# Patient Record
Sex: Male | Born: 1955 | Race: White | Hispanic: No | Marital: Single | Smoking: Former smoker
Health system: Southern US, Community
[De-identification: ages and names within clinical notes are randomized; demographics above are authoritative.]

---

## 2003-09-19 ENCOUNTER — Emergency Department (HOSPITAL_COMMUNITY): Admission: EM | Admit: 2003-09-19 | Discharge: 2003-09-20 | Payer: Self-pay | Admitting: Emergency Medicine

## 2006-07-20 ENCOUNTER — Ambulatory Visit (HOSPITAL_COMMUNITY): Admission: RE | Admit: 2006-07-20 | Discharge: 2006-07-21 | Payer: Self-pay | Admitting: Neurological Surgery

## 2006-07-24 ENCOUNTER — Emergency Department (HOSPITAL_COMMUNITY): Admission: EM | Admit: 2006-07-24 | Discharge: 2006-07-24 | Payer: Self-pay | Admitting: Emergency Medicine

## 2018-01-31 ENCOUNTER — Encounter (HOSPITAL_COMMUNITY): Payer: Self-pay | Admitting: Emergency Medicine

## 2018-01-31 ENCOUNTER — Ambulatory Visit (HOSPITAL_COMMUNITY)
Admission: EM | Admit: 2018-01-31 | Discharge: 2018-01-31 | Disposition: A | Discharge status: Home / Self Care | Payer: BLUE CROSS/BLUE SHIELD | Attending: Internal Medicine | Admitting: Internal Medicine

## 2018-01-31 DIAGNOSIS — Z23 Encounter for immunization: Secondary | ICD-10-CM | POA: Diagnosis not present

## 2018-01-31 DIAGNOSIS — S41111A Laceration without foreign body of right upper arm, initial encounter: Secondary | ICD-10-CM

## 2018-01-31 DIAGNOSIS — W260XXA Contact with knife, initial encounter: Secondary | ICD-10-CM

## 2018-01-31 MED ORDER — TETANUS-DIPHTH-ACELL PERTUSSIS 5-2.5-18.5 LF-MCG/0.5 IM SUSP
0.5000 mL | Freq: Once | INTRAMUSCULAR | Status: AC
  Administered 2018-01-31: 0.5 mL via INTRAMUSCULAR

## 2018-01-31 MED ORDER — LIDOCAINE-EPINEPHRINE (PF) 2 %-1:200000 IJ SOLN
INTRAMUSCULAR | Status: AC
  Filled 2018-01-31: qty 20

## 2018-01-31 MED ORDER — TETANUS-DIPHTH-ACELL PERTUSSIS 5-2.5-18.5 LF-MCG/0.5 IM SUSP
INTRAMUSCULAR | Status: AC
  Filled 2018-01-31: qty 0.5

## 2018-01-31 NOTE — ED Provider Notes (Signed)
[facility]    CSN: [id number] Arrival date & time: 01/31/18  1511     History   Chief Complaint Chief Complaint  Patient presents with  . Laceration    HPI Garrett Cooper is a 62 y.o. male.   62 year old male comes in for laceration to the right upper arm.  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  Bleeding controlled with pressure and gauze.  He denies any numbness and tingling, weakness in the fingers.  Denies blood thinner use.  Unknown last tetanus.      History reviewed. No pertinent past medical history.  There are no active problems to display for this patient.   History reviewed. No pertinent surgical history.     Home Medications    Prior to Admission medications   Not on File    Family History History reviewed. No pertinent family history.  Social History Social History   Tobacco Use  . Smoking status: Current Every Day Smoker  . Smokeless tobacco: Never Used  Substance Use Topics  . Alcohol use: Not on file  . Drug use: Never     Allergies   Oxycodone   Review of Systems Review of Systems  Reason unable to perform ROS: See HPI as above.     Physical Exam Triage Vital Signs ED Triage Vitals [01/31/18 1537]  Enc Vitals Group     BP 126/88     Pulse Rate 87     Resp 16     Temp (!) 97.4 F (36.3 C)     Temp Source Oral     SpO2 95 %     Weight      Height      Head Circumference      Peak Flow      Pain Score      Pain Loc      Pain Edu?      Excl. in GC?    No data found.  Updated Vital Signs BP 126/88 (BP Location: Right Arm)   Pulse 87   Temp (!) 97.4 F (36.3 C) (Oral)   Resp 16   SpO2 95%    Physical Exam  Constitutional: He is oriented to person, place, and time. He appears well-developed and well-nourished. No distress.  HENT:  Head: Normocephalic and atraumatic.  Eyes: Pupils are equal, round, and reactive to light. Conjunctivae are  normal.  Musculoskeletal:  1 cm laceration to the ventral aspect of the right upper arm, 4 to 5 cm superior from the elbow.  Bleeding controlled with pressure.  No foreign body seen.  No tenderness to palpation of the area.  Strength normal and equal bilaterally of the elbow, wrist, fingers.  Sensation intact and equal bilaterally.  Radial pulse 2+ and equal bilaterally.  Cap refill less than 2 seconds.  Neurological: He is alert and oriented to person, place, and time.  Skin: He is not diaphoretic.     UC Treatments / Results  Labs (all labs ordered are listed, but only abnormal results are displayed) Labs Reviewed - No data to display  EKG None  Radiology No results found.  Procedures Laceration Repair Date/Time: 01/31/2018 4:39 PM Performed by: Belinda FisherYu, Dakari Cregger V, PA-C Authorized by: Arnaldo Nataliamond, Michael S, MD   Consent:    Consent obtained:  Verbal   Consent given by:  Patient   Risks discussed:  Infection, need for additional repair, nerve damage, pain, poor cosmetic  result, poor wound healing, retained foreign body, tendon damage and vascular damage   Alternatives discussed:  Referral Anesthesia (see MAR for exact dosages):    Anesthesia method:  Local infiltration   Local anesthetic:  Lidocaine 2% WITH epi Laceration details:    Location:  Shoulder/arm   Shoulder/arm location:  R upper arm   Length (cm):  1   Depth (mm):  3 Repair type:    Repair type:  Simple Exploration:    Hemostasis achieved with:  Direct pressure and epinephrine   Wound exploration: wound explored through full range of motion and entire depth of wound probed and visualized   Treatment:    Area cleansed with:  Hibiclens   Amount of cleaning:  Standard   Irrigation solution:  Sterile saline   Irrigation method:  Pressure wash   Visualized foreign bodies/material removed: no   Skin repair:    Repair method:  Sutures   Suture size:  5-0   Suture material:  Prolene   Suture technique:  Simple  interrupted   Number of sutures:  2 Approximation:    Approximation:  Close Post-procedure details:    Dressing:  Antibiotic ointment and bulky dressing   Patient tolerance of procedure:  Tolerated well, no immediate complications   (including critical care time)  Medications Ordered in UC Medications  Tdap (BOOSTRIX) injection 0.5 mL (has no administration in time range)    Initial Impression / Assessment and Plan / UC Course  I have reviewed the triage vital signs and the nursing notes.  Pertinent labs & imaging results that were available during my care of the patient were reviewed by me and considered in my medical decision making (see chart for details).    Patient tolerated procedure well.  2 sutures applied today.  Tetanus updated.  Wound care instructions provided.  Return precautions given.  Otherwise follow-up in 7 days for suture removal.  Resources on hand orthopedics provided, instructed to follow-up if developing symptoms such as weakness to the fingers, numbness, tingling.  Patient expresses understanding and agrees to plan.  Final Clinical Impressions(s) / UC Diagnoses   Final diagnoses:  Laceration of right upper extremity, initial encounter    ED Prescriptions    None        Belinda Fisher, PA-C 01/31/18 1641

## 2018-01-31 NOTE — Discharge Instructions (Signed)
Tetanus updated. 2 sutures placed.  You can remove current dressing in 24 hours.  Do not get the area wet for the first 24 hours.  Afterwards, he can clean area with soap and water gently.  Keep wound clean and dry.  Monitor for spreading redness, increased warmth, fever follow-up for reevaluation.  Otherwise follow-up in 7 days for suture removal.  If experiencing numbness and tingling, weakness in your arm/fingers, follow-up with orthopedics for further evaluation needed.

## 2018-01-31 NOTE — ED Triage Notes (Signed)
Pt here for laceration from knife to right upper arm

## 2018-02-06 ENCOUNTER — Encounter (HOSPITAL_COMMUNITY): Payer: Self-pay | Admitting: Emergency Medicine

## 2018-02-06 ENCOUNTER — Ambulatory Visit (HOSPITAL_COMMUNITY)
Admission: EM | Admit: 2018-02-06 | Discharge: 2018-02-06 | Disposition: A | Discharge status: Home / Self Care | Payer: BLUE CROSS/BLUE SHIELD

## 2018-02-06 DIAGNOSIS — S41111D Laceration without foreign body of right upper arm, subsequent encounter: Secondary | ICD-10-CM | POA: Diagnosis not present

## 2018-02-06 DIAGNOSIS — Z4802 Encounter for removal of sutures: Secondary | ICD-10-CM

## 2018-02-06 NOTE — ED Triage Notes (Signed)
Pt here for suture removal to back of arm; 2 sutures removed

## 2019-08-15 ENCOUNTER — Other Ambulatory Visit: Payer: Self-pay | Admitting: *Deleted

## 2019-08-15 DIAGNOSIS — Z87891 Personal history of nicotine dependence: Secondary | ICD-10-CM

## 2019-08-29 LAB — COLOGUARD

## 2019-08-30 ENCOUNTER — Telehealth: Payer: Self-pay | Admitting: Acute Care

## 2019-08-30 NOTE — Telephone Encounter (Signed)
Attempted to call pt x2 to get more information from him but each time I called, the line rang busy. Will try to call back later.

## 2019-09-02 ENCOUNTER — Ambulatory Visit
Admission: RE | Admit: 2019-09-02 | Discharge: 2019-09-02 | Disposition: A | Discharge status: Home / Self Care | Payer: 59 | Source: Ambulatory Visit | Attending: Acute Care | Admitting: Acute Care

## 2019-09-02 ENCOUNTER — Ambulatory Visit (INDEPENDENT_AMBULATORY_CARE_PROVIDER_SITE_OTHER): Payer: 59 | Admitting: Acute Care

## 2019-09-02 ENCOUNTER — Encounter: Payer: Self-pay | Admitting: Acute Care

## 2019-09-02 ENCOUNTER — Other Ambulatory Visit: Payer: Self-pay

## 2019-09-02 VITALS — BP 130/68 | HR 62 | Temp 97.9°F | Ht 73.0 in | Wt 168.4 lb

## 2019-09-02 DIAGNOSIS — Z87891 Personal history of nicotine dependence: Secondary | ICD-10-CM

## 2019-09-02 DIAGNOSIS — F17210 Nicotine dependence, cigarettes, uncomplicated: Secondary | ICD-10-CM | POA: Diagnosis not present

## 2019-09-02 DIAGNOSIS — J329 Chronic sinusitis, unspecified: Secondary | ICD-10-CM

## 2019-09-02 NOTE — Telephone Encounter (Signed)
Patient has never been seen at this office - 1st appt is today with Maralyn Sago for [facility]   Patient c/o lingering sinus infection x1 month with thick clear mucus, PND, prod cough with thick clear mucus that "chokes him."  Denies any purulent sputum, hemoptysis, wheezing, chest tightness, known sick contact.  Patient has seen new PCP Dr Merri Brunette x2 for these symptoms - last visit was last week with renewal of abx but he does not know the name.  Advised patient that because he has never been established with his office, cannot guarantee that Maralyn Sago will be able to effectively treat his symptoms and recommended that he contact Dr Carolee Rota office again.  Patient stated that he prefers to not do this, is tired of being sick and would like to return to work.  Advised patient will send message to Maralyn Sago to see if assistance can be provided at today's visit.

## 2019-09-02 NOTE — Telephone Encounter (Signed)
Attempted to call pt but unable to reach. Left a detailed message on pt's machine about the info stated by SG. Pt did have SDMV with SG at 1:30 so hopefully this was discussed with pt at that time to contact PCP. If not, I left pt that info in message when called. Nothing further needed.

## 2019-09-02 NOTE — Patient Instructions (Signed)
Thank you for participating in the [facility]. It was our pleasure to meet you today. We will call you with the results of your scan within the next few days. Your scan will be assigned a Lung RADS category score by the physicians reading the scans.  This Lung RADS score determines follow up scanning.  See below for description of categories, and follow up screening recommendations. We will be in touch to schedule your follow up screening annually or based on recommendations of our providers. We will fax a copy of your scan results to your Primary Care Physician, or the physician who referred you to the program, to ensure they have the results. Please call the office if you have any questions or concerns regarding your scanning experience or results.  Our office number is [phone]. Please speak with Denise Phelps, RN. She is our Lung Cancer Screening RN. If she is unavailable when you call, please have the office staff send her a message. She will return your call at her earliest convenience. Remember, if your scan is normal, we will scan you annually as long as you continue to meet the criteria for the program. (Age 55-77, Current smoker or smoker who has quit within the last 15 years). If you are a smoker, remember, quitting is the single most powerful action that you can take to decrease your risk of lung cancer and other pulmonary, breathing related problems. We know quitting is hard, and we are here to help.  Please let us know if there is anything we can do to help you meet your goal of quitting. If you are a former smoker, congratulations. We are proud of you! Remain smoke free! Remember you can refer friends or family members through the number above.  We will screen them to make sure they meet criteria for the program. Thank you for helping us take better care of you by participating in Lung Screening.  Lung RADS Categories:  Lung RADS 1: no nodules  or definitely non-concerning nodules.  Recommendation is for a repeat annual scan in 12 months.  Lung RADS 2:  nodules that are non-concerning in appearance and behavior with a very low likelihood of becoming an active cancer. Recommendation is for a repeat annual scan in 12 months.  Lung RADS 3: nodules that are probably non-concerning , includes nodules with a low likelihood of becoming an active cancer.  Recommendation is for a 6-month repeat screening scan. Often noted after an upper respiratory illness. We will be in touch to make sure you have no questions, and to schedule your 6-month scan.  Lung RADS 4 A: nodules with concerning findings, recommendation is most often for a follow up scan in 3 months or additional testing based on our provider's assessment of the scan. We will be in touch to make sure you have no questions and to schedule the recommended 3 month follow up scan.  Lung RADS 4 B:  indicates findings that are concerning. We will be in touch with you to schedule additional diagnostic testing based on our provider's  assessment of the scan.   

## 2019-09-02 NOTE — Telephone Encounter (Signed)
Shared decision making visits cannot  also be OV for issues. These acute  issues will need to be managed by PCP. Thanks.

## 2019-09-02 NOTE — Progress Notes (Signed)
Shared Decision Making Visit Lung Cancer Screening Program [phone])   Eligibility:  Age 64 y.o.  Pack Years Smoking History Calculation 44 pack year smoking history (# packs/per year x # years smoked)  Recent History of coughing up blood  no  Unexplained weight loss? no ( >Than 15 pounds within the last 6 months )  Prior History Lung / other cancer no (Diagnosis within the last 5 years already requiring surveillance chest CT Scans).  Smoking Status Former Smoker  Former Smokers: Years since quit: 1 year  Quit Date: 2016  Visit Components:  Discussion included one or more decision making aids. yes  Discussion included risk/benefits of screening. yes  Discussion included potential follow up diagnostic testing for abnormal scans. yes  Discussion included meaning and risk of over diagnosis. yes  Discussion included meaning and risk of False Positives. yes  Discussion included meaning of total radiation exposure. yes  Counseling Included:  Importance of adherence to annual lung cancer LDCT screening. yes  Impact of comorbidities on ability to participate in the program. yes  Ability and willingness to under diagnostic treatment. yes  Smoking Cessation Counseling:  Current Smokers:   Discussed importance of smoking cessation. yes  Information about tobacco cessation classes and interventions provided to patient. yes  Patient provided with "ticket" for LDCT Scan. yes  Symptomatic Patient. no  Counseling: NA  Diagnosis Code: Tobacco Use Z72.0  Asymptomatic Patient yes  Counseling (Intermediate counseling: > three minutes counseling) V0350  Former Smokers:   Discussed the importance of maintaining cigarette abstinence. yes  Diagnosis Code: Personal History of Nicotine Dependence. K93.818  Information about tobacco cessation classes and interventions provided to patient. Yes  Patient provided with "ticket" for LDCT Scan. yes  Written Order for Lung  Cancer Screening with LDCT placed in Epic. Yes (CT Chest Lung Cancer Screening Low Dose W/O CM) [id number] Z12.2-Screening of respiratory organs Z87.891-Personal history of nicotine dependence  BP 130/68 (BP Location: Right Arm, Patient Position: Sitting, Cuff Size: Normal)   Pulse 62   Temp 97.9 F (36.6 C) (Temporal)   Ht 6\' 1"  (1.854 m)   Wt 168 lb 6.4 oz (76.4 kg)   SpO2 98% Comment: on RA  BMI 22.22 kg/m    I spent 25 minutes of face to face time with Mr. Mattie discussing the risks and benefits of lung cancer screening. We viewed a power point together that explained in detail the above noted topics. We took the time to pause the power point at intervals to allow for questions to be asked and answered to ensure understanding. We discussed that he had taken the single most powerful action possible to decrease his risk of developing lung cancer when he quit smoking. I counseled him to remain smoke free, and to contact me if he ever had the desire to smoke again so that I can provide resources and tools to help support the effort to remain smoke free. We discussed the time and location of the scan, and that either  Doroteo Glassman RN or I will call with the results within  24-48 hours of receiving them. He has my card and contact information in the event he needs to speak with me, in addition to a copy of the power point we reviewed as a resource. He verbalized understanding of all of the above and had no further questions upon leaving the office.     I explained to the patient that there has been a high incidence of coronary artery disease  noted on these exams. I explained that this is a non-gated exam therefore degree or severity cannot be determined. This patient is not on statin therapy. I have asked the patient to follow-up with their PCP regarding any incidental finding of coronary artery disease and management with diet or medication as they feel is clinically indicated. The patient  verbalized understanding of the above and had no further questions.     Bevelyn Ngo, NP 09/02/2019 1:56 PM

## 2019-09-02 NOTE — Progress Notes (Signed)
Please call patient and let them  know their  low dose Ct was read as a Lung RADS 2: nodules that are benign in appearance and behavior with a very low likelihood of becoming a clinically active cancer due to size or lack of growth. Recommendation per radiology is for a repeat LDCT in 12 months. .Please let them  know we will order and schedule their  annual screening scan for 09/2020. Please let them  know there was notation of CAD on their  scan.  Please remind the patient  that this is a non-gated exam therefore degree or severity of disease  cannot be determined. Please have them  follow up with their PCP regarding potential risk factor modification, dietary therapy or pharmacologic therapy if clinically indicated. Pt.  is not currently on statin therapy. Please place order for annual  screening scan for  09/2020 and fax results to PCP. Thanks so much. 

## 2019-09-03 ENCOUNTER — Other Ambulatory Visit: Payer: Self-pay | Admitting: *Deleted

## 2019-09-03 DIAGNOSIS — Z87891 Personal history of nicotine dependence: Secondary | ICD-10-CM

## 2019-10-16 LAB — COLOGUARD: COLOGUARD: NEGATIVE

## 2020-08-18 DIAGNOSIS — E7849 Other hyperlipidemia: Secondary | ICD-10-CM | POA: Diagnosis not present

## 2020-08-18 DIAGNOSIS — Z0001 Encounter for general adult medical examination with abnormal findings: Secondary | ICD-10-CM | POA: Diagnosis not present

## 2020-08-18 DIAGNOSIS — Z125 Encounter for screening for malignant neoplasm of prostate: Secondary | ICD-10-CM | POA: Diagnosis not present

## 2020-08-20 DIAGNOSIS — Z87891 Personal history of nicotine dependence: Secondary | ICD-10-CM | POA: Diagnosis not present

## 2020-08-20 DIAGNOSIS — I700 Atherosclerosis of aorta: Secondary | ICD-10-CM | POA: Diagnosis not present

## 2020-08-20 DIAGNOSIS — R0981 Nasal congestion: Secondary | ICD-10-CM | POA: Diagnosis not present

## 2020-08-20 DIAGNOSIS — E7849 Other hyperlipidemia: Secondary | ICD-10-CM | POA: Diagnosis not present

## 2020-08-20 DIAGNOSIS — Z23 Encounter for immunization: Secondary | ICD-10-CM | POA: Diagnosis not present

## 2020-08-20 DIAGNOSIS — Z0001 Encounter for general adult medical examination with abnormal findings: Secondary | ICD-10-CM | POA: Diagnosis not present

## 2020-10-15 DIAGNOSIS — Z20822 Contact with and (suspected) exposure to covid-19: Secondary | ICD-10-CM | POA: Diagnosis not present

## 2021-08-23 DIAGNOSIS — E7849 Other hyperlipidemia: Secondary | ICD-10-CM | POA: Diagnosis not present

## 2021-08-23 DIAGNOSIS — I700 Atherosclerosis of aorta: Secondary | ICD-10-CM | POA: Diagnosis not present

## 2021-08-23 DIAGNOSIS — Z125 Encounter for screening for malignant neoplasm of prostate: Secondary | ICD-10-CM | POA: Diagnosis not present

## 2021-08-26 DIAGNOSIS — Z1212 Encounter for screening for malignant neoplasm of rectum: Secondary | ICD-10-CM | POA: Diagnosis not present

## 2021-08-26 DIAGNOSIS — Z0000 Encounter for general adult medical examination without abnormal findings: Secondary | ICD-10-CM | POA: Diagnosis not present

## 2021-08-26 DIAGNOSIS — Z87891 Personal history of nicotine dependence: Secondary | ICD-10-CM | POA: Diagnosis not present

## 2021-08-26 DIAGNOSIS — M2550 Pain in unspecified joint: Secondary | ICD-10-CM | POA: Diagnosis not present

## 2021-08-26 DIAGNOSIS — I700 Atherosclerosis of aorta: Secondary | ICD-10-CM | POA: Diagnosis not present

## 2021-08-26 DIAGNOSIS — J309 Allergic rhinitis, unspecified: Secondary | ICD-10-CM | POA: Diagnosis not present

## 2021-08-26 DIAGNOSIS — B070 Plantar wart: Secondary | ICD-10-CM | POA: Diagnosis not present

## 2021-09-02 ENCOUNTER — Other Ambulatory Visit: Payer: Self-pay | Admitting: Internal Medicine

## 2021-09-02 DIAGNOSIS — Z87891 Personal history of nicotine dependence: Secondary | ICD-10-CM

## 2021-09-22 DIAGNOSIS — Z1211 Encounter for screening for malignant neoplasm of colon: Secondary | ICD-10-CM | POA: Diagnosis not present

## 2021-09-28 ENCOUNTER — Ambulatory Visit
Admission: RE | Admit: 2021-09-28 | Discharge: 2021-09-28 | Disposition: A | Discharge status: Home / Self Care | Payer: Self-pay | Source: Ambulatory Visit | Attending: Internal Medicine | Admitting: Internal Medicine

## 2021-09-28 DIAGNOSIS — Z87891 Personal history of nicotine dependence: Secondary | ICD-10-CM

## 2021-10-19 ENCOUNTER — Telehealth: Payer: Self-pay | Admitting: Acute Care

## 2021-10-19 NOTE — Telephone Encounter (Signed)
Dr. Renne Crigler scheduled this 09/29/2021 CT Chest. It did not come to my box. It was read as a Lung RADS 2: nodules that are benign in appearance and behavior with a very low likelihood of becoming a clinically active cancer due to size or lack of growth. Recommendation per radiology is for a repeat LDCT in 12 months.  ?Please send him a letter. Can we check with Pharr's office to see if he is planning on taking over his scanning, or if he would prefer that we continue to scan the patient? Thanks so much ?

## 2021-10-20 ENCOUNTER — Other Ambulatory Visit: Payer: Self-pay

## 2021-10-20 DIAGNOSIS — Z87891 Personal history of nicotine dependence: Secondary | ICD-10-CM

## 2022-04-29 IMAGING — CT CT CHEST LUNG CANCER SCREENING LOW DOSE W/O CM
2 of 5 series · 15 of 40 positions shown, 18 images · non-contrast
Comparison: 09/02/2019 chest CT

CLINICAL DATA: 65-year-old asymptomatic male former smoker with 44
pack-year smoking history, quit smoking 7 years prior.



[Series 4: lung 1.00 br44 cor · coronal · 0.72mm/px · 3 of 361 slices shown]
[im 73/361  lung]
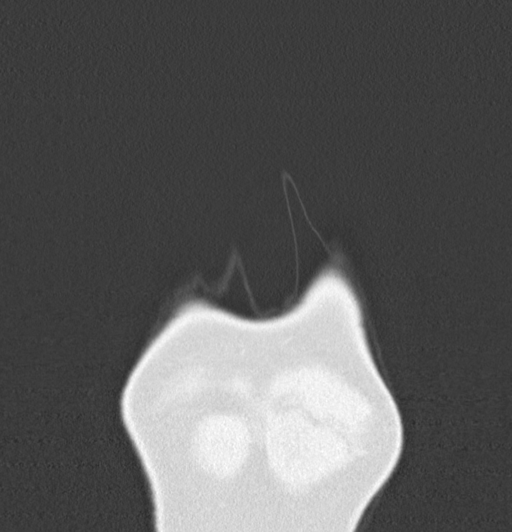
[im 145/361  lung]
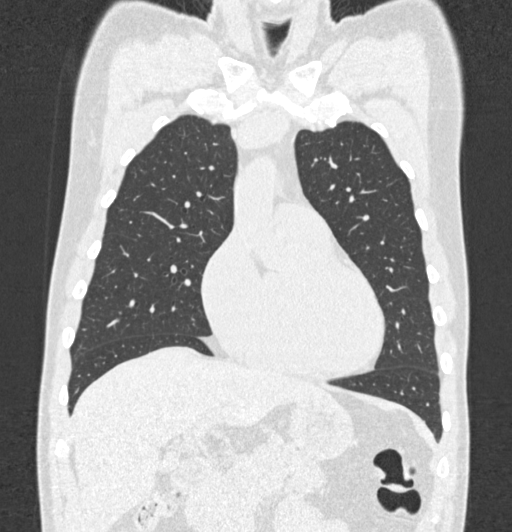
[im 217/361  lung]
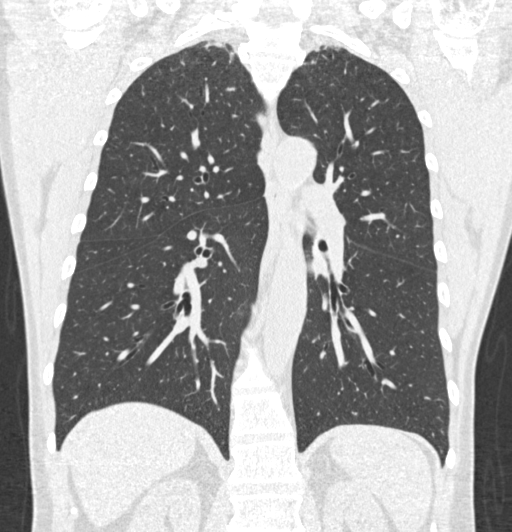

[Series 9: lung 1.00 br60 axial · axial · 0.72mm/px · z∈[-1076,-728]mm · 12 of 384 slices shown, 15 images]
[im 18/384  mediastinal]
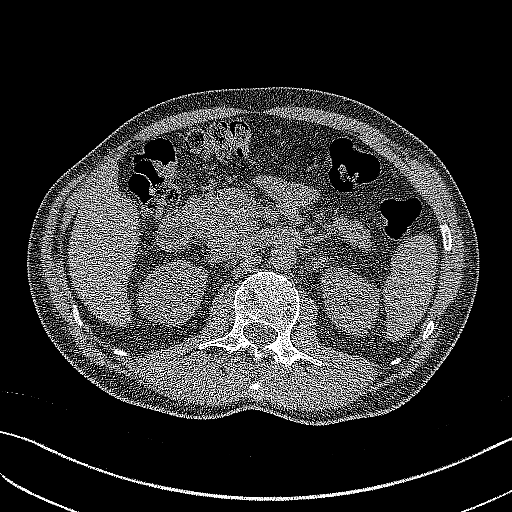
[im 18/384  lung]
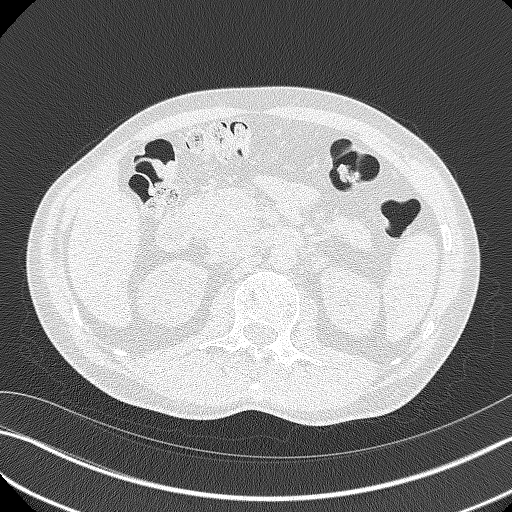
[im 53/384  lung]
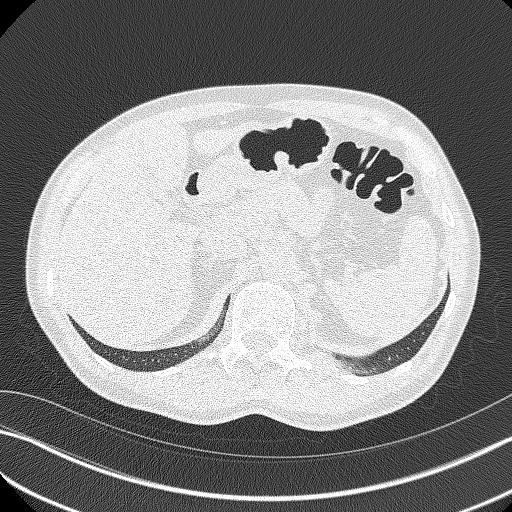
[im 88/384  lung]
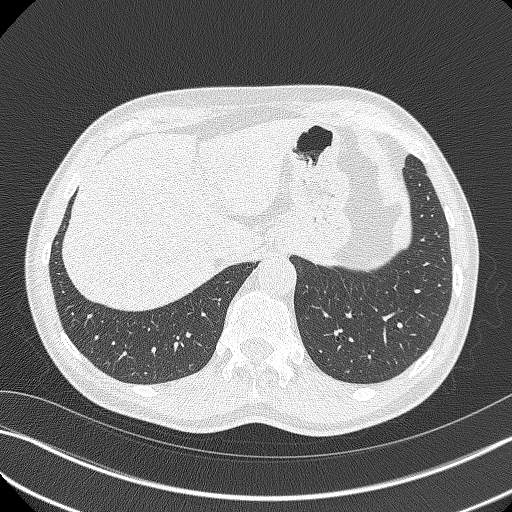
[im 122/384  lung]
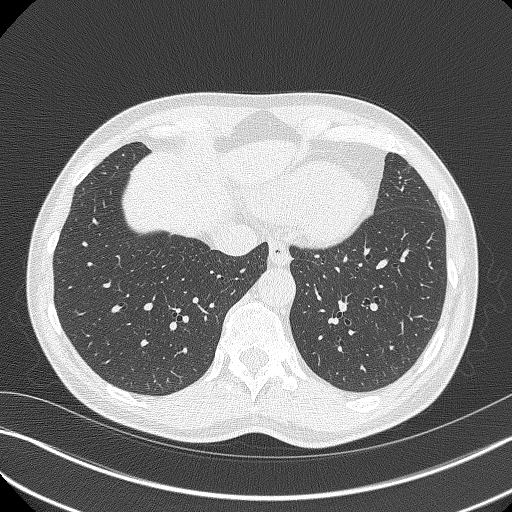
[im 140/384  mediastinal]
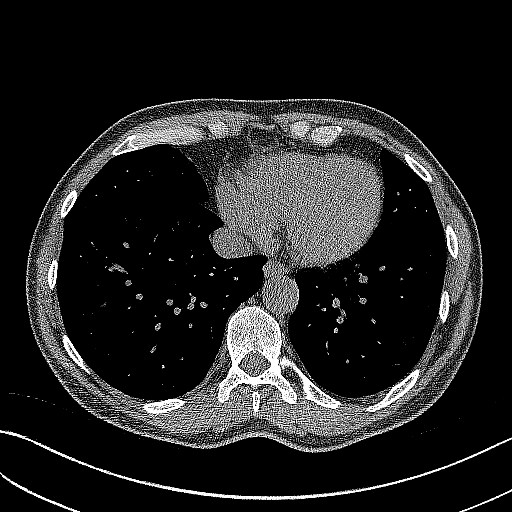
[im 140/384  lung]
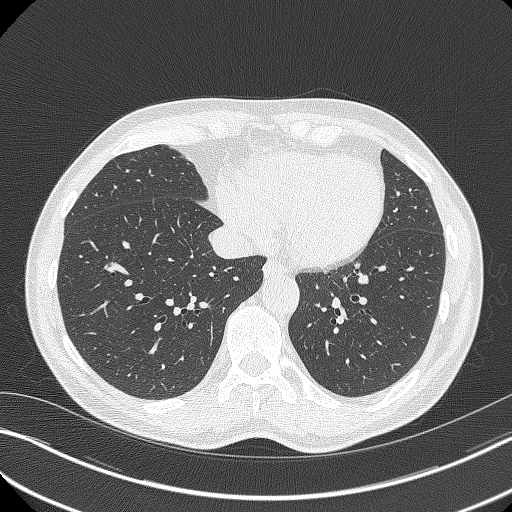
[im 175/384  lung]
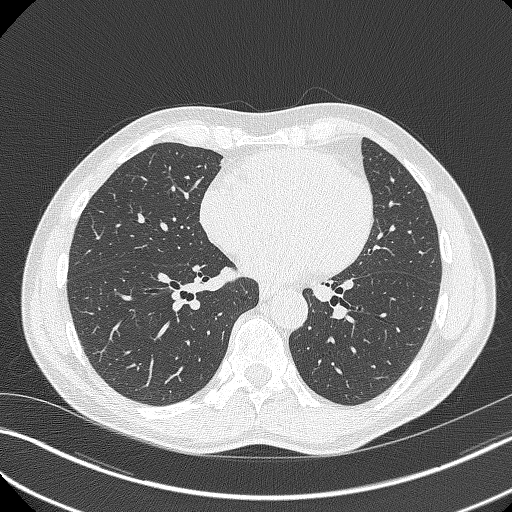
[im 209/384  lung]
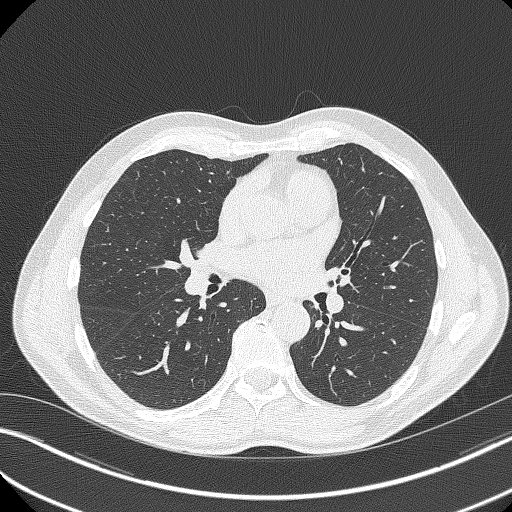
[im 244/384  lung]
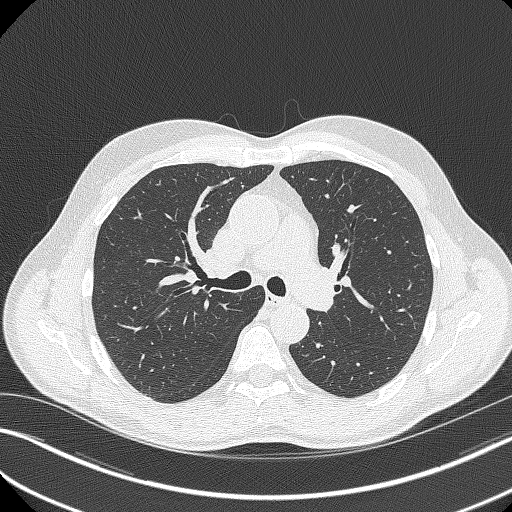
[im 262/384  mediastinal]
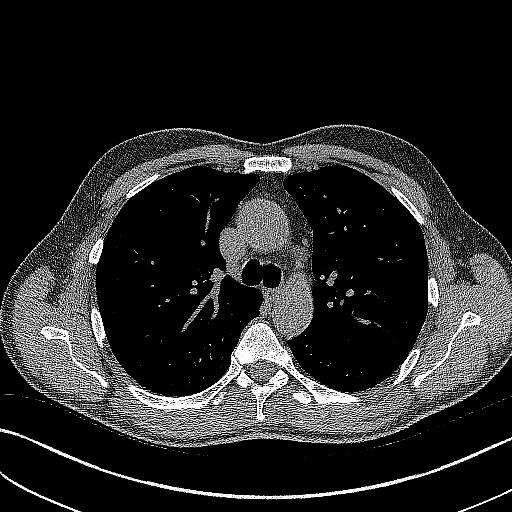
[im 262/384  lung]
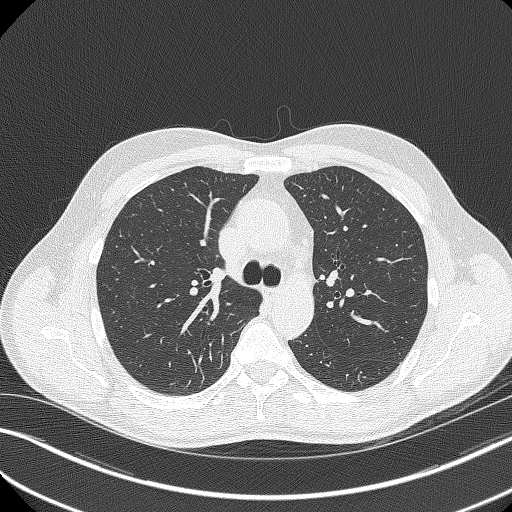
[im 296/384  lung]
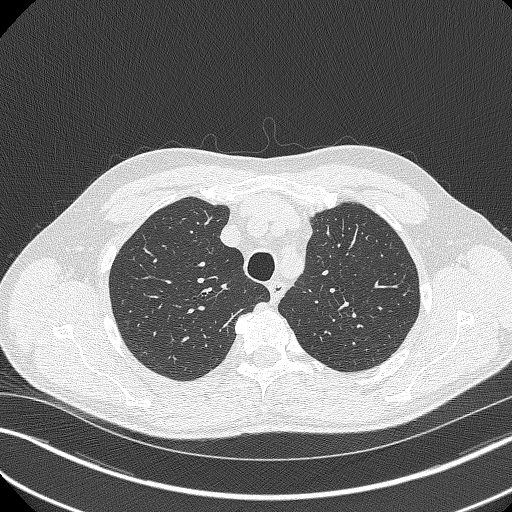
[im 331/384  lung]
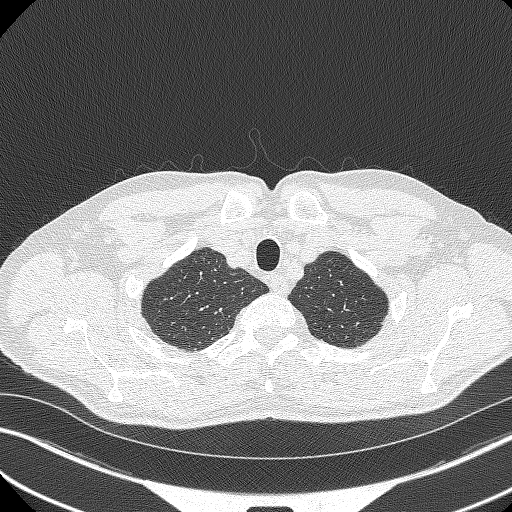
[im 366/384  lung]
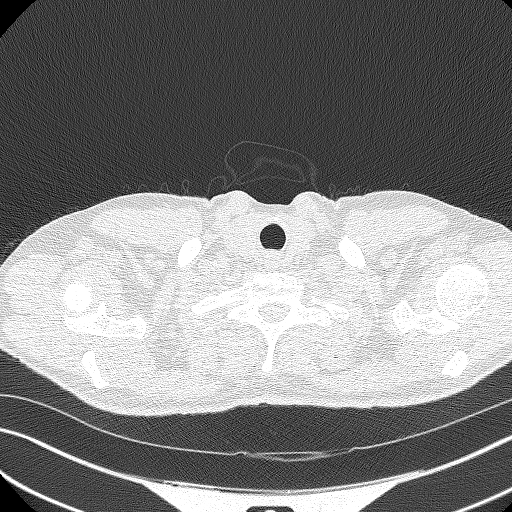

[15 of 40 positions shown; findings below may reference images not displayed]

FINDINGS: Cardiovascular: Normal heart size. No significant pericardial
effusion/thickening. Mildly atherosclerotic nonaneurysmal thoracic
aorta. Normal caliber pulmonary arteries.

Mediastinum/Nodes: No discrete thyroid nodules. Unremarkable
esophagus. No pathologically enlarged axillary, mediastinal or hilar
lymph nodes, noting limited sensitivity for the detection of hilar
adenopathy on this noncontrast study.

Lungs/Pleura: No pneumothorax. No pleural effusion. Mild
centrilobular emphysema with diffuse bronchial wall thickening. No
acute consolidative airspace disease or lung masses. No significant
growth of previously visualized pulmonary nodules. No new
significant pulmonary nodules.

Upper abdomen: No acute abnormality.

Musculoskeletal: No aggressive appearing focal osseous lesions. Mild
thoracic spondylosis.
IMPRESSION: Lung-RADS 2, benign appearance or behavior. Continue annual
screening with low-dose chest CT without contrast in 12 months.

Aortic Atherosclerosis (Q2DS8-JPF.F) and Emphysema (Q2DS8-P06.5).

## 2022-08-26 DIAGNOSIS — Z125 Encounter for screening for malignant neoplasm of prostate: Secondary | ICD-10-CM | POA: Diagnosis not present

## 2022-08-26 DIAGNOSIS — E7849 Other hyperlipidemia: Secondary | ICD-10-CM | POA: Diagnosis not present

## 2022-08-31 DIAGNOSIS — Z0000 Encounter for general adult medical examination without abnormal findings: Secondary | ICD-10-CM | POA: Diagnosis not present

## 2022-08-31 DIAGNOSIS — I700 Atherosclerosis of aorta: Secondary | ICD-10-CM | POA: Diagnosis not present

## 2022-08-31 DIAGNOSIS — Z87891 Personal history of nicotine dependence: Secondary | ICD-10-CM | POA: Diagnosis not present

## 2022-08-31 DIAGNOSIS — J439 Emphysema, unspecified: Secondary | ICD-10-CM | POA: Diagnosis not present

## 2022-08-31 DIAGNOSIS — E7849 Other hyperlipidemia: Secondary | ICD-10-CM | POA: Diagnosis not present

## 2022-08-31 DIAGNOSIS — J309 Allergic rhinitis, unspecified: Secondary | ICD-10-CM | POA: Diagnosis not present

## 2022-09-28 ENCOUNTER — Ambulatory Visit
Admission: RE | Admit: 2022-09-28 | Discharge: 2022-09-28 | Disposition: A | Discharge status: Home / Self Care | Payer: Medicare HMO | Source: Ambulatory Visit | Attending: Internal Medicine | Admitting: Internal Medicine

## 2022-09-28 DIAGNOSIS — R911 Solitary pulmonary nodule: Secondary | ICD-10-CM | POA: Diagnosis not present

## 2022-09-28 DIAGNOSIS — Z87891 Personal history of nicotine dependence: Secondary | ICD-10-CM | POA: Diagnosis not present

## 2022-09-28 DIAGNOSIS — I700 Atherosclerosis of aorta: Secondary | ICD-10-CM | POA: Diagnosis not present

## 2022-09-28 DIAGNOSIS — J432 Centrilobular emphysema: Secondary | ICD-10-CM | POA: Diagnosis not present

## 2022-09-29 ENCOUNTER — Other Ambulatory Visit: Payer: Self-pay | Admitting: Acute Care

## 2022-09-29 DIAGNOSIS — Z122 Encounter for screening for malignant neoplasm of respiratory organs: Secondary | ICD-10-CM

## 2022-09-29 DIAGNOSIS — Z87891 Personal history of nicotine dependence: Secondary | ICD-10-CM

## 2022-10-12 DIAGNOSIS — H524 Presbyopia: Secondary | ICD-10-CM | POA: Diagnosis not present

## 2022-10-12 DIAGNOSIS — Z0100 Encounter for examination of eyes and vision without abnormal findings: Secondary | ICD-10-CM | POA: Diagnosis not present

## 2022-10-26 LAB — COLOGUARD

## 2023-07-31 ENCOUNTER — Other Ambulatory Visit: Payer: Self-pay | Admitting: Acute Care

## 2023-07-31 DIAGNOSIS — Z122 Encounter for screening for malignant neoplasm of respiratory organs: Secondary | ICD-10-CM

## 2023-07-31 DIAGNOSIS — Z87891 Personal history of nicotine dependence: Secondary | ICD-10-CM

## 2023-08-31 DIAGNOSIS — Z125 Encounter for screening for malignant neoplasm of prostate: Secondary | ICD-10-CM | POA: Diagnosis not present

## 2023-08-31 DIAGNOSIS — E7849 Other hyperlipidemia: Secondary | ICD-10-CM | POA: Diagnosis not present

## 2023-08-31 DIAGNOSIS — I700 Atherosclerosis of aorta: Secondary | ICD-10-CM | POA: Diagnosis not present

## 2023-09-05 DIAGNOSIS — L853 Xerosis cutis: Secondary | ICD-10-CM | POA: Diagnosis not present

## 2023-09-05 DIAGNOSIS — I700 Atherosclerosis of aorta: Secondary | ICD-10-CM | POA: Diagnosis not present

## 2023-09-05 DIAGNOSIS — Z0000 Encounter for general adult medical examination without abnormal findings: Secondary | ICD-10-CM | POA: Diagnosis not present

## 2023-09-05 DIAGNOSIS — B351 Tinea unguium: Secondary | ICD-10-CM | POA: Diagnosis not present

## 2023-09-05 DIAGNOSIS — J439 Emphysema, unspecified: Secondary | ICD-10-CM | POA: Diagnosis not present

## 2023-09-05 DIAGNOSIS — Z8249 Family history of ischemic heart disease and other diseases of the circulatory system: Secondary | ICD-10-CM | POA: Diagnosis not present

## 2023-09-05 DIAGNOSIS — J309 Allergic rhinitis, unspecified: Secondary | ICD-10-CM | POA: Diagnosis not present

## 2023-09-05 DIAGNOSIS — F17210 Nicotine dependence, cigarettes, uncomplicated: Secondary | ICD-10-CM | POA: Diagnosis not present

## 2023-09-05 DIAGNOSIS — Z23 Encounter for immunization: Secondary | ICD-10-CM | POA: Diagnosis not present

## 2023-09-11 ENCOUNTER — Encounter: Payer: Self-pay | Admitting: Acute Care

## 2023-10-02 ENCOUNTER — Ambulatory Visit
Admission: RE | Admit: 2023-10-02 | Discharge: 2023-10-02 | Disposition: A | Discharge status: Home / Self Care | Payer: Medicare HMO | Source: Ambulatory Visit | Attending: Acute Care | Admitting: Acute Care

## 2023-10-02 DIAGNOSIS — Z87891 Personal history of nicotine dependence: Secondary | ICD-10-CM | POA: Diagnosis not present

## 2023-10-02 DIAGNOSIS — Z122 Encounter for screening for malignant neoplasm of respiratory organs: Secondary | ICD-10-CM

## 2023-10-16 ENCOUNTER — Other Ambulatory Visit: Payer: Self-pay

## 2023-10-16 DIAGNOSIS — Z87891 Personal history of nicotine dependence: Secondary | ICD-10-CM

## 2023-10-16 DIAGNOSIS — Z122 Encounter for screening for malignant neoplasm of respiratory organs: Secondary | ICD-10-CM

## 2023-10-27 DIAGNOSIS — Z5181 Encounter for therapeutic drug level monitoring: Secondary | ICD-10-CM | POA: Diagnosis not present

## 2023-11-22 DIAGNOSIS — H5203 Hypermetropia, bilateral: Secondary | ICD-10-CM | POA: Diagnosis not present
